# Patient Record
Sex: Male | Born: 1955 | Race: White | Hispanic: No | Marital: Married | Smoking: Never smoker
Health system: Southern US, Community
[De-identification: ages and names within clinical notes are randomized; demographics above are authoritative.]

## PROBLEM LIST (undated history)

## (undated) DIAGNOSIS — I10 Essential (primary) hypertension: Secondary | ICD-10-CM

---

## 2011-03-15 ENCOUNTER — Emergency Department (INDEPENDENT_AMBULATORY_CARE_PROVIDER_SITE_OTHER): Payer: No Typology Code available for payment source

## 2011-03-15 ENCOUNTER — Encounter (HOSPITAL_BASED_OUTPATIENT_CLINIC_OR_DEPARTMENT_OTHER): Payer: Self-pay | Admitting: *Deleted

## 2011-03-15 ENCOUNTER — Emergency Department (HOSPITAL_BASED_OUTPATIENT_CLINIC_OR_DEPARTMENT_OTHER)
Admission: EM | Admit: 2011-03-15 | Discharge: 2011-03-15 | Disposition: A | Discharge status: Home / Self Care | Payer: No Typology Code available for payment source | Attending: Emergency Medicine | Admitting: Emergency Medicine

## 2011-03-15 DIAGNOSIS — S139XXA Sprain of joints and ligaments of unspecified parts of neck, initial encounter: Secondary | ICD-10-CM | POA: Insufficient documentation

## 2011-03-15 DIAGNOSIS — S46919A Strain of unspecified muscle, fascia and tendon at shoulder and upper arm level, unspecified arm, initial encounter: Secondary | ICD-10-CM

## 2011-03-15 DIAGNOSIS — Y92410 Unspecified street and highway as the place of occurrence of the external cause: Secondary | ICD-10-CM | POA: Insufficient documentation

## 2011-03-15 DIAGNOSIS — M47812 Spondylosis without myelopathy or radiculopathy, cervical region: Secondary | ICD-10-CM

## 2011-03-15 DIAGNOSIS — S0003XA Contusion of scalp, initial encounter: Secondary | ICD-10-CM

## 2011-03-15 DIAGNOSIS — IMO0002 Reserved for concepts with insufficient information to code with codable children: Secondary | ICD-10-CM

## 2011-03-15 DIAGNOSIS — S0083XA Contusion of other part of head, initial encounter: Secondary | ICD-10-CM

## 2011-03-15 DIAGNOSIS — S4980XA Other specified injuries of shoulder and upper arm, unspecified arm, initial encounter: Secondary | ICD-10-CM

## 2011-03-15 DIAGNOSIS — M25519 Pain in unspecified shoulder: Secondary | ICD-10-CM

## 2011-03-15 DIAGNOSIS — G9389 Other specified disorders of brain: Secondary | ICD-10-CM

## 2011-03-15 DIAGNOSIS — S060X9A Concussion with loss of consciousness of unspecified duration, initial encounter: Secondary | ICD-10-CM

## 2011-03-15 DIAGNOSIS — I10 Essential (primary) hypertension: Secondary | ICD-10-CM | POA: Insufficient documentation

## 2011-03-15 DIAGNOSIS — S161XXA Strain of muscle, fascia and tendon at neck level, initial encounter: Secondary | ICD-10-CM

## 2011-03-15 HISTORY — DX: Essential (primary) hypertension: I10

## 2011-03-15 MED ORDER — HYDROCODONE-ACETAMINOPHEN 5-500 MG PO TABS: 1.0000 | ORAL_TABLET | Freq: Four times a day (QID) | ORAL | Status: AC | PRN
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
## 2011-03-15 NOTE — ED Provider Notes (Signed)
Medical screening examination/treatment/procedure(s) were performed by non-physician practitioner and as supervising physician I was immediately available for consultation/collaboration.   Muslima Toppins R Myrle Dues, MD 03/15/11 1608 

## 2020-09-17 ENCOUNTER — Encounter (HOSPITAL_BASED_OUTPATIENT_CLINIC_OR_DEPARTMENT_OTHER): Payer: Self-pay | Admitting: *Deleted

## 2020-09-17 ENCOUNTER — Other Ambulatory Visit: Payer: Self-pay

## 2020-09-17 ENCOUNTER — Emergency Department (HOSPITAL_BASED_OUTPATIENT_CLINIC_OR_DEPARTMENT_OTHER)
Admission: EM | Admit: 2020-09-17 | Discharge: 2020-09-17 | Disposition: A | Discharge status: Home / Self Care | Payer: Medicare Other | Attending: Emergency Medicine | Admitting: Emergency Medicine

## 2020-09-17 ENCOUNTER — Emergency Department (HOSPITAL_BASED_OUTPATIENT_CLINIC_OR_DEPARTMENT_OTHER): Payer: Medicare Other

## 2020-09-17 DIAGNOSIS — Z23 Encounter for immunization: Secondary | ICD-10-CM | POA: Diagnosis not present

## 2020-09-17 DIAGNOSIS — Z79899 Other long term (current) drug therapy: Secondary | ICD-10-CM | POA: Insufficient documentation

## 2020-09-17 DIAGNOSIS — R17 Unspecified jaundice: Secondary | ICD-10-CM | POA: Insufficient documentation

## 2020-09-17 DIAGNOSIS — S50811A Abrasion of right forearm, initial encounter: Secondary | ICD-10-CM | POA: Insufficient documentation

## 2020-09-17 DIAGNOSIS — S71112A Laceration without foreign body, left thigh, initial encounter: Secondary | ICD-10-CM | POA: Insufficient documentation

## 2020-09-17 DIAGNOSIS — W540XXA Bitten by dog, initial encounter: Secondary | ICD-10-CM | POA: Insufficient documentation

## 2020-09-17 DIAGNOSIS — Y92009 Unspecified place in unspecified non-institutional (private) residence as the place of occurrence of the external cause: Secondary | ICD-10-CM | POA: Diagnosis not present

## 2020-09-17 DIAGNOSIS — I10 Essential (primary) hypertension: Secondary | ICD-10-CM | POA: Insufficient documentation

## 2020-09-17 DIAGNOSIS — S6991XA Unspecified injury of right wrist, hand and finger(s), initial encounter: Secondary | ICD-10-CM | POA: Diagnosis present

## 2020-09-17 DIAGNOSIS — S61431A Puncture wound without foreign body of right hand, initial encounter: Secondary | ICD-10-CM | POA: Diagnosis not present

## 2020-09-17 DIAGNOSIS — S71152A Open bite, left thigh, initial encounter: Secondary | ICD-10-CM

## 2020-09-17 MED ORDER — AMOXICILLIN-POT CLAVULANATE 875-125 MG PO TABS
1.0000 | ORAL_TABLET | Freq: Once | ORAL | Status: AC
  Administered 2020-09-17: 1 via ORAL
  Filled 2020-09-17: qty 1

## 2020-09-17 MED ORDER — OXYCODONE-ACETAMINOPHEN 5-325 MG PO TABS
1.0000 | ORAL_TABLET | Freq: Once | ORAL | Status: AC
  Administered 2020-09-17: 1 via ORAL
  Filled 2020-09-17: qty 1

## 2020-09-17 MED ORDER — HYDROCODONE-ACETAMINOPHEN 5-325 MG PO TABS: 1.0000 | ORAL_TABLET | ORAL | 0 refills | Status: AC | PRN

## 2020-09-17 MED ORDER — TETANUS-DIPHTH-ACELL PERTUSSIS 5-2.5-18.5 LF-MCG/0.5 IM SUSY
0.5000 mL | PREFILLED_SYRINGE | Freq: Once | INTRAMUSCULAR | Status: AC
  Administered 2020-09-17: 0.5 mL via INTRAMUSCULAR
  Filled 2020-09-17: qty 0.5

## 2020-09-17 MED ORDER — AMOXICILLIN-POT CLAVULANATE 875-125 MG PO TABS: 1.0000 | ORAL_TABLET | Freq: Two times a day (BID) | ORAL | 0 refills | Status: AC

## 2020-09-17 MED ORDER — LIDOCAINE-EPINEPHRINE (PF) 2 %-1:200000 IJ SOLN
10.0000 mL | Freq: Once | INTRAMUSCULAR | Status: AC
  Administered 2020-09-17: 10 mL
  Filled 2020-09-17: qty 20

## 2020-09-17 NOTE — ED Notes (Signed)
All wounds cleaned and irrigated with NS, bleeding controlled.  Provider aware

## 2020-09-17 NOTE — Discharge Instructions (Addendum)
Recommend recheck with your primary care provider in 2 days.  Suture removal in 10 to 12 days. Take antibiotics as prescribed and complete the full course.  Take Norco as needed as prescribed for pain. Wear splint as needed for pain relief.  Follow-up with animal control to verify the dog's vaccine status.  If the dog is not vaccinated against rabies, it should be quarantined.

## 2020-09-17 NOTE — ED Provider Notes (Signed)
[facility] EMERGENCY DEPARTMENT Provider Note   CSN: [id number] Arrival date & time: 09/17/20  1639     History Chief Complaint  Patient presents with  . Animal Bite    Brendan Blankenship is a 65 y.o. male.  65 year old male presents for evaluation after a dog bite today.  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  Patient turned to see what had happened and the dog then bit him in his right hand.  No other bites.  Injury has been reported to animal control.  Last tetanus unknown.  Patient is not anticoagulated.  No history of diabetes.  No other injuries or concerns.      Past Medical History:  Diagnosis Date  . Hypertension     There are no problems to display for this patient.   History reviewed. No pertinent surgical history.     No family history on file.  Social History   Tobacco Use  . Smoking status: Never  . Smokeless tobacco: Never  Vaping Use  . Vaping Use: Never used  Substance Use Topics  . Alcohol use: Yes  . Drug use: No    Home Medications Prior to Admission medications   Medication Sig Start Date End Date Taking? Authorizing Provider  amLODipine (NORVASC) 10 MG tablet Take 1 tablet by mouth daily. 07/09/20  Yes [provider]  amoxicillin-clavulanate (AUGMENTIN) 875-125 MG tablet Take 1 tablet by mouth every 12 (twelve) hours. 09/17/20  Yes Jeannie Fend, PA-C  doxazosin (CARDURA) 2 MG tablet Take 1 tablet by mouth daily. 07/09/20  Yes [provider]  Doxazosin Mesylate (CARDURA PO) Take by mouth.   Yes [provider]  hydrochlorothiazide (HYDRODIURIL) 25 MG tablet Take 1 tablet by mouth daily. 01/02/20  Yes [provider]  HYDROcodone-acetaminophen (NORCO/VICODIN) 5-325 MG tablet Take 1 tablet by mouth every 4 (four) hours as needed. 09/17/20  Yes Jeannie Fend, PA-C  losartan (COZAAR) 100 MG tablet Take 1 tablet by mouth daily. 01/02/20  Yes [provider]  DOXAZOSIN MESYLATE PO Take by mouth.    [provider]  Irbesartan-Hydrochlorothiazide (AVALIDE PO) Take by mouth.    [provider]    Allergies    Codeine  Review of Systems   Review of Systems  Constitutional:  Negative for fever.  Musculoskeletal:  Positive for arthralgias, joint swelling and myalgias.  Skin:  Positive for wound. Negative for color change and rash.  Allergic/Immunologic: Negative for immunocompromised state.  Neurological:  Negative for weakness and numbness.  Hematological:  Does not bruise/bleed easily.  Psychiatric/Behavioral:  Negative for self-injury.   All other systems reviewed and are negative.  Physical Exam Updated Vital Signs BP 121/75 (BP Location: Left Arm)   Pulse 94   Temp 98.2 F (36.8 C) (Oral)   Resp 20   Ht 5\' 10"  (1.778 m)   Wt 124.7 kg   SpO2 96%   BMI 39.46 kg/m   Physical Exam Vitals and nursing note reviewed.  Constitutional:      General: He is not in acute distress.    Appearance: He is well-developed. He is not diaphoretic.  HENT:     Head: Normocephalic and atraumatic.  Cardiovascular:     Pulses: Normal pulses.  Pulmonary:     Effort: Pulmonary effort is normal.  Musculoskeletal:        General: Swelling, tenderness and signs of injury present. No deformity.  Comments: V-shaped puncture wound to dorsal aspect of right hand webspace between thumb and index finger.  Puncture wound to the thenar eminence right hand.  Abrasions to right forearm.  V-shaped laceration to left thigh with additional abrasions.  Skin:    General: Skin is warm and dry.     Coloration: Skin is jaundiced.  Neurological:     Mental Status: He is alert and oriented to person, place, and time.     Sensory: No sensory deficit.     Motor: No weakness.  Psychiatric:        Behavior: Behavior normal.    ED Results / Procedures / Treatments   Labs (all labs ordered are listed, but only abnormal results are  displayed) Labs Reviewed - No data to display  EKG None  Radiology DG Hand Complete Right  Result Date: 09/17/2020 CLINICAL DATA:  Dog bite EXAM: RIGHT HAND - COMPLETE 3+ VIEW COMPARISON:  None. FINDINGS: There is no evidence of fracture or dislocation. There is no evidence of arthropathy or other focal bone abnormality. Soft tissues are unremarkable. IMPRESSION: Negative. Electronically Signed   By: Charlett Nose M.D.   On: 09/17/2020 18:20   DG Femur Min 2 Views Left  Result Date: 09/17/2020 CLINICAL DATA:  Dog bite EXAM: LEFT FEMUR 2 VIEWS COMPARISON:  None. FINDINGS: No fracture of the LEFT femur.  No radiodense foreign body. Soft tissue injury to the mid thigh musculature with interspersed gas. IMPRESSION: No fracture or foreign body.  Soft tissue injury as above. Electronically Signed   By: Genevive Bi M.D.   On: 09/17/2020 18:21    Procedures .Marland KitchenLaceration Repair  Date/Time: 09/17/2020 6:47 PM Performed by: Jeannie Fend, PA-C Authorized by: Jeannie Fend, PA-C   Consent:    Consent obtained:  Verbal   Consent given by:  Patient   Risks discussed:  Infection, need for additional repair, pain, poor cosmetic result and poor wound healing   Alternatives discussed:  No treatment and delayed treatment Universal protocol:    Procedure explained and questions answered to patient or proxy's satisfaction: yes     Relevant documents present and verified: yes     Test results available: yes     Imaging studies available: yes     Required blood products, implants, devices, and special equipment available: yes     Site/side marked: yes     Immediately prior to procedure, a time out was called: yes     Patient identity confirmed:  Verbally with patient Anesthesia:    Anesthesia method:  Local infiltration   Local anesthetic:  Lidocaine 2% WITH epi Laceration details:    Location:  Hand   Hand location:  R hand, dorsum   Length (cm):  2   Depth (mm):  3 Pre-procedure details:     Preparation:  Patient was prepped and draped in usual sterile fashion and imaging obtained to evaluate for foreign bodies Exploration:    Hemostasis achieved with:  Epinephrine   Imaging obtained: x-ray     Imaging outcome: foreign body not noted     Wound exploration: wound explored through full range of motion and entire depth of wound visualized     Wound extent: no foreign bodies/material noted, no muscle damage noted and no underlying fracture noted   Treatment:    Area cleansed with:  Saline   Amount of cleaning:  Extensive   Irrigation solution:  Sterile saline Skin repair:    Repair method:  Sutures  Suture size:  4-0   Suture material:  Nylon   Suture technique:  Vertical mattress   Number of sutures:  1 Approximation:    Approximation:  Loose Repair type:    Repair type:  Simple Post-procedure details:    Dressing:  Splint for protection   Procedure completion:  Tolerated well, no immediate complications .Marland Kitchen.Laceration Repair  Date/Time: 09/17/2020 6:49 PM Performed by: Jeannie FendMurphy, Nancie Bocanegra A, PA-C Authorized by: Jeannie FendMurphy, [place] A, PA-C   Consent:    Consent obtained:  Verbal   Consent given by:  Patient   Risks discussed:  Infection, need for additional repair, pain, poor cosmetic result and poor wound healing   Alternatives discussed:  No treatment and delayed treatment Universal protocol:    Procedure explained and questions answered to patient or proxy's satisfaction: yes     Relevant documents present and verified: yes     Test results available: yes     Imaging studies available: yes     Required blood products, implants, devices, and special equipment available: yes     Site/side marked: yes     Immediately prior to procedure, a time out was called: yes     Patient identity confirmed:  Verbally with patient Anesthesia:    Anesthesia method:  Local infiltration   Local anesthetic:  Lidocaine 2% WITH epi Laceration details:    Location:  Leg   Leg location:  L  upper leg   Length (cm):  2.5   Depth (mm):  3 Pre-procedure details:    Preparation:  Patient was prepped and draped in usual sterile fashion and imaging obtained to evaluate for foreign bodies Exploration:    Hemostasis achieved with:  Epinephrine   Imaging obtained: x-ray     Imaging outcome: foreign body not noted     Wound exploration: wound explored through full range of motion and entire depth of wound visualized     Wound extent: no foreign bodies/material noted, no muscle damage noted and no underlying fracture noted   Treatment:    Area cleansed with:  Saline   Amount of cleaning:  Extensive   Irrigation solution:  Sterile saline Skin repair:    Repair method:  Sutures   Suture size:  4-0   Suture material:  Nylon   Suture technique:  Vertical mattress   Number of sutures:  1 Approximation:    Approximation:  Loose Repair type:    Repair type:  Simple Post-procedure details:    Dressing:  Bulky dressing   Procedure completion:  Tolerated well, no immediate complications   Medications Ordered in ED Medications  lidocaine-EPINEPHrine (XYLOCAINE W/EPI) 2 %-1:200000 (PF) injection 10 mL (10 mLs Infiltration Given 09/17/20 1812)  amoxicillin-clavulanate (AUGMENTIN) 875-125 MG per tablet 1 tablet (1 tablet Oral Given 09/17/20 1751)  oxyCODONE-acetaminophen (PERCOCET/ROXICET) 5-325 MG per tablet 1 tablet (1 tablet Oral Given 09/17/20 1752)  Tdap (BOOSTRIX) injection 0.5 mL (0.5 mLs Intramuscular Given 09/17/20 1752)    ED Course  I have reviewed the triage vital signs and the nursing notes.  Pertinent labs & imaging results that were available during my care of the patient were reviewed by me and considered in my medical decision making (see chart for details).  Clinical Course as of 09/17/20 1859  Tue Sep 17, 2020  [id number] 65 year old male presents for evaluation after dog bite as above.  Found to have flap laceration to the right hand and left thigh.  X-rays obtained and are  negative for bony injury or  foreign body.  Wounds were anesthetized and thoroughly irrigated with saline.  Tetanus was updated, patient was given first dose of Augmentin.  Wounds were closed with 1 vertical mattress suture to reduce keeping, loosely approximated.  Recommend wound recheck with PCP in 2 days, suture removal in 10 to 12 days.  Given prescription for Norco as well as Augmentin.  Patient to follow-up with animal control, report has been made, regarding dog's vaccine status or quarantine. [LM]    Clinical Course User Index [LM] Alden Hipp   MDM Rules/Calculators/A&P                           Final Clinical Impression(s) / ED Diagnoses Final diagnoses:  Dog bite of right hand, initial encounter  Dog bite of thigh, left, initial encounter    Rx / DC Orders ED Discharge Orders          Ordered    amoxicillin-clavulanate (AUGMENTIN) 875-125 MG tablet  Every 12 hours        09/17/20 1843    HYDROcodone-acetaminophen (NORCO/VICODIN) 5-325 MG tablet  Every 4 hours PRN        09/17/20 1843             Jeannie Fend, PA-C 09/17/20 1859    Jacalyn Lefevre, MD 09/17/20 2120

## 2020-09-17 NOTE — ED Triage Notes (Signed)
He was working on a job and the persons dog attacked him. Bite to his left upper leg and right hand. Puncture wounds with dried blood on his skin. He did not get rabies proof from the owner. The owner "told" him the rabies vaccine is UTD. Animal control has not been notified.

## 2020-09-17 NOTE — ED Notes (Signed)
Patient transported to X-ray 

## 2020-09-17 NOTE — ED Notes (Signed)
approx 4pm pt attacked by dog at a job site.  Puncture wounds to rt. Hand/forearm and left back thigh.  Bruising noted to all areas.  Bleeding controlled

## 2020-11-09 DEATH — deceased

## 2022-04-27 IMAGING — DX DG HAND COMPLETE 3+V*R*
3 series · 3 of 3 positions shown · non-contrast
Comparison: None.

CLINICAL DATA: Dog bite

EXAM:
RIGHT HAND - COMPLETE 3+ VIEW

[hand pa]
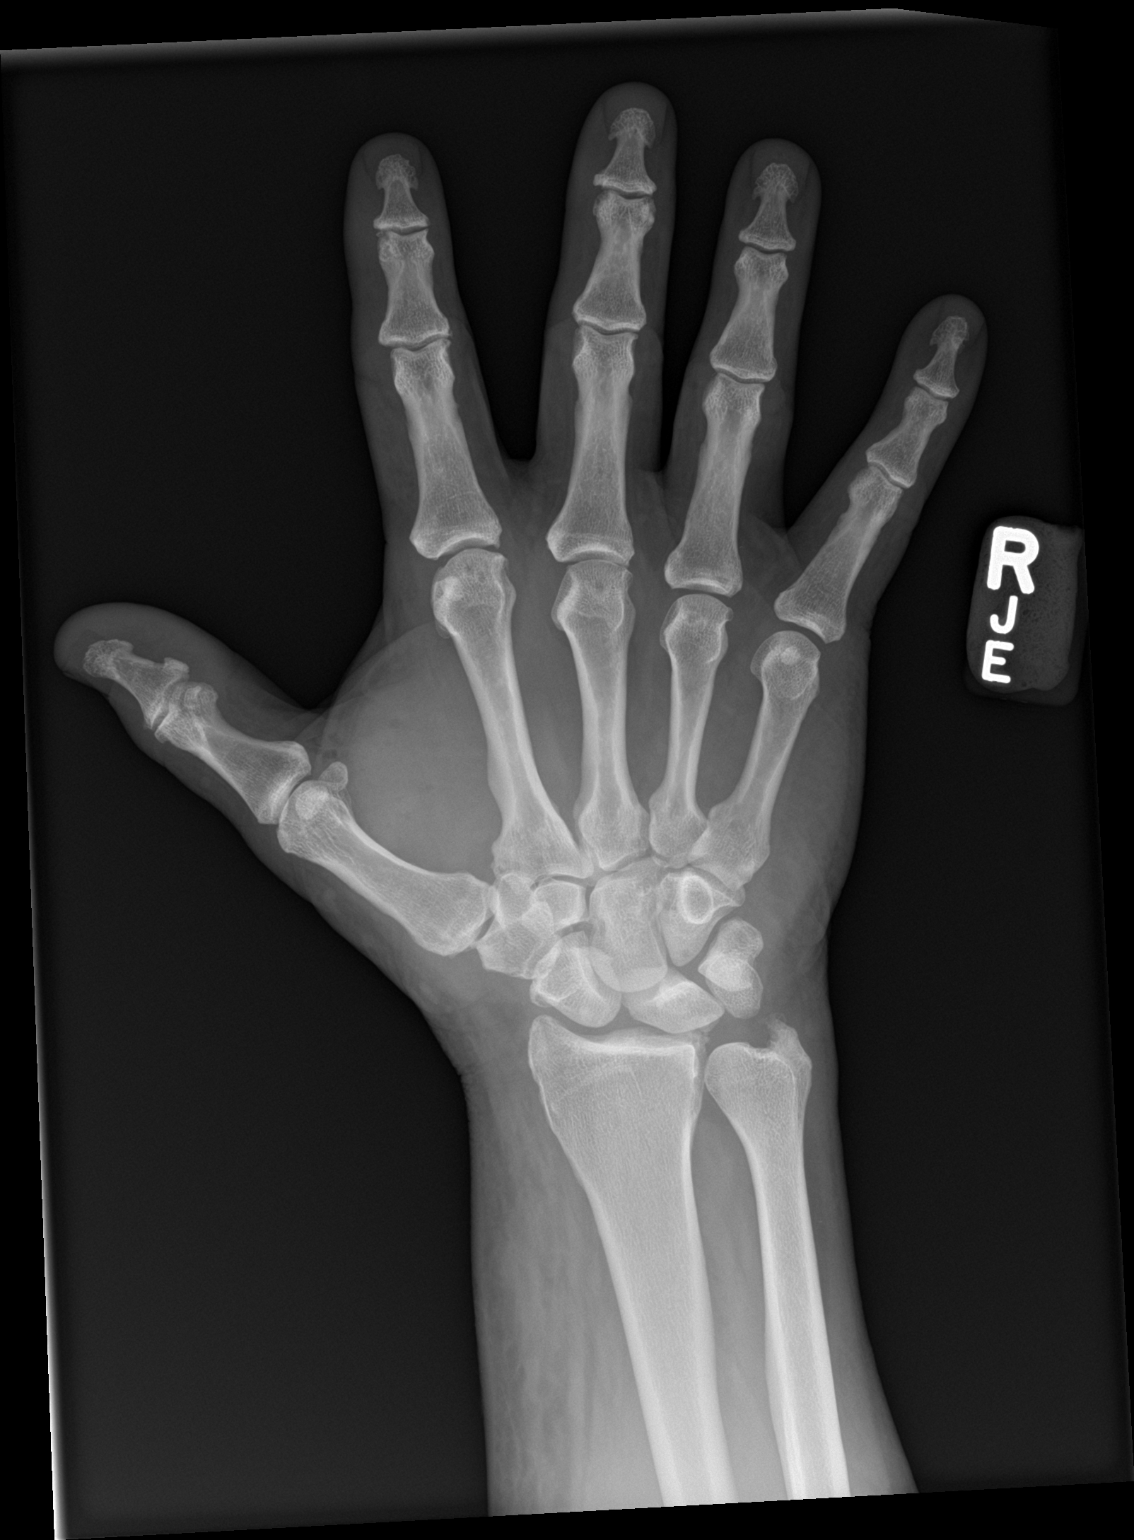

[hand obl]
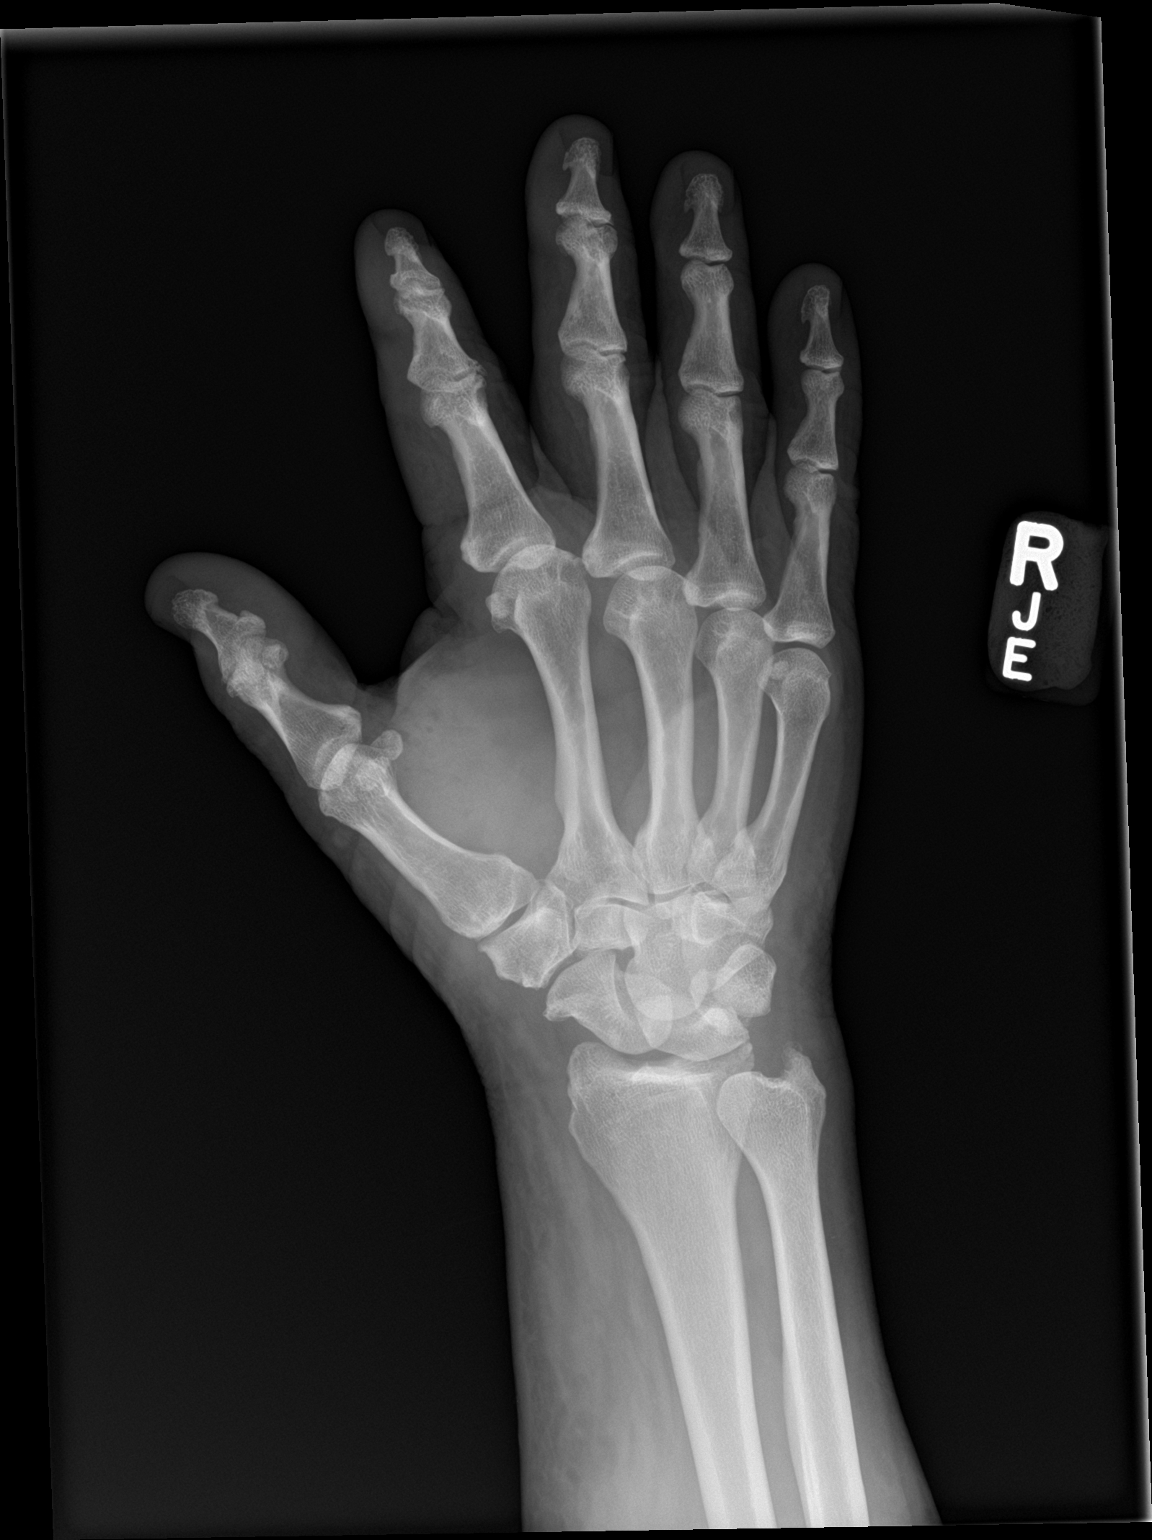

[hand lat]
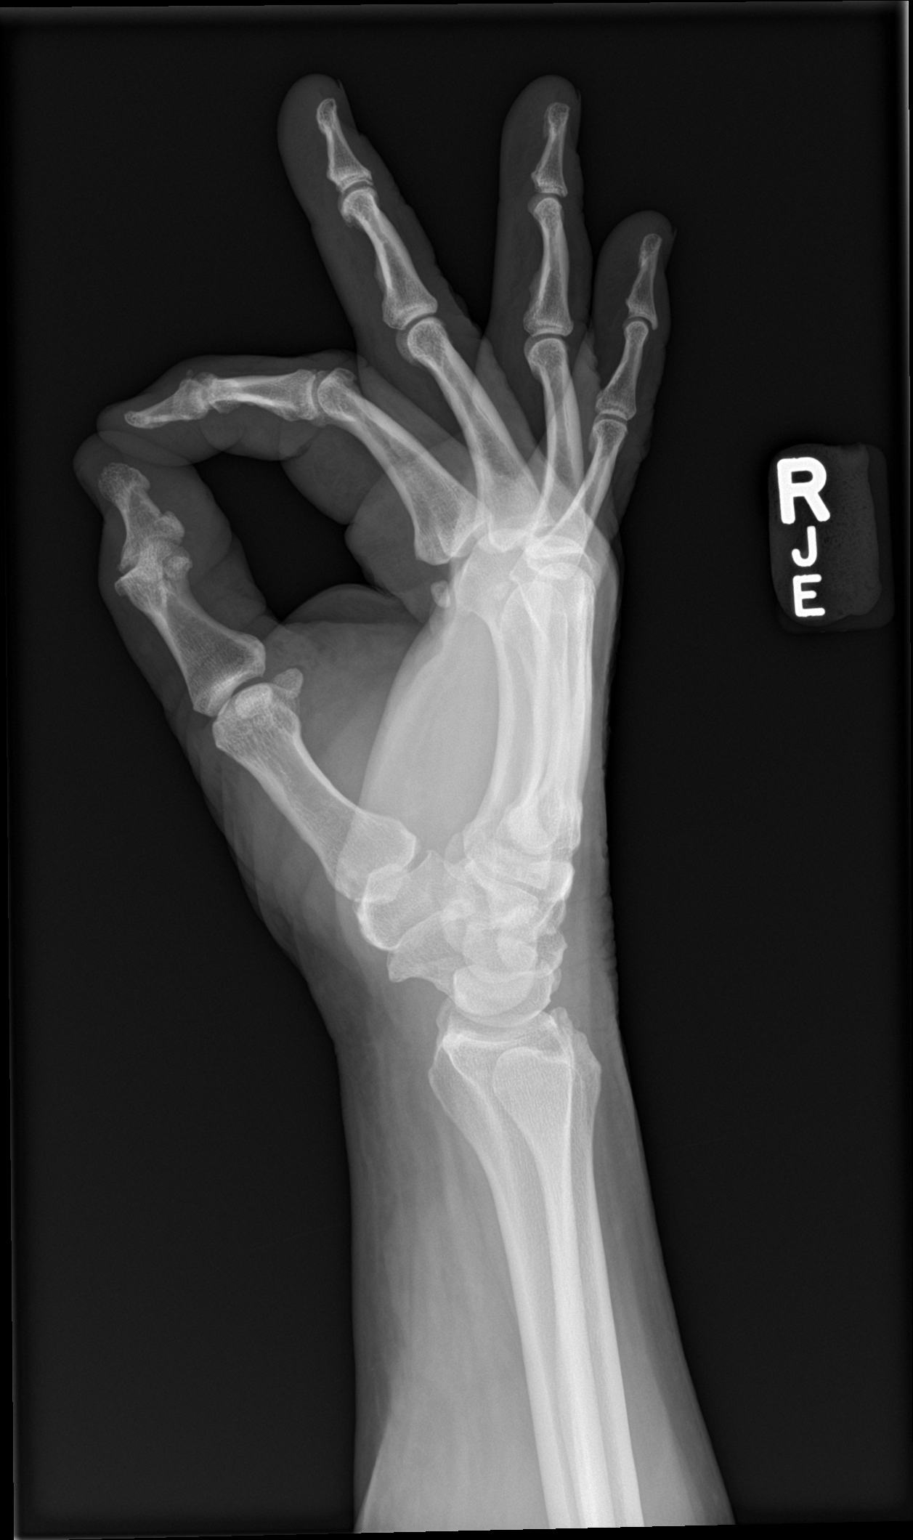

[3 of 3 positions shown; findings below may reference images not displayed]

FINDINGS: There is no evidence of fracture or dislocation. There is no
evidence of arthropathy or other focal bone abnormality. Soft
tissues are unremarkable.
IMPRESSION: Negative.
